# Patient Record
Sex: Male | Born: 1995 | Race: White | Hispanic: No | Marital: Single | Smoking: Never smoker
Health system: Southern US, Community
[De-identification: ages and names within clinical notes are randomized; demographics above are authoritative.]

---

## 2018-07-24 ENCOUNTER — Emergency Department (HOSPITAL_COMMUNITY): Payer: BLUE CROSS/BLUE SHIELD

## 2018-07-24 ENCOUNTER — Emergency Department (HOSPITAL_COMMUNITY)
Admission: EM | Admit: 2018-07-24 | Discharge: 2018-07-24 | Disposition: A | Discharge status: Home / Self Care | Payer: BLUE CROSS/BLUE SHIELD | Attending: Emergency Medicine | Admitting: Emergency Medicine

## 2018-07-24 ENCOUNTER — Encounter (HOSPITAL_COMMUNITY): Payer: Self-pay | Admitting: Emergency Medicine

## 2018-07-24 DIAGNOSIS — R05 Cough: Secondary | ICD-10-CM | POA: Diagnosis present

## 2018-07-24 DIAGNOSIS — J40 Bronchitis, not specified as acute or chronic: Secondary | ICD-10-CM | POA: Diagnosis not present

## 2018-07-24 LAB — CBC WITH DIFFERENTIAL/PLATELET
ABS IMMATURE GRANULOCYTES: 0.06 10*3/uL (ref 0.00–0.07)
BASOS PCT: 0 %
Basophils Absolute: 0 10*3/uL (ref 0.0–0.1)
EOS ABS: 0.5 10*3/uL (ref 0.0–0.5)
EOS PCT: 4 %
HEMATOCRIT: 48.7 % (ref 39.0–52.0)
HEMOGLOBIN: 15.9 g/dL (ref 13.0–17.0)
Immature Granulocytes: 0 %
Lymphocytes Relative: 7 %
Lymphs Abs: 0.9 10*3/uL (ref 0.7–4.0)
MCH: 27.7 pg (ref 26.0–34.0)
MCHC: 32.6 g/dL (ref 30.0–36.0)
MCV: 85 fL (ref 80.0–100.0)
MONO ABS: 0.9 10*3/uL (ref 0.1–1.0)
Monocytes Relative: 7 %
NEUTROS ABS: 11 10*3/uL — AB (ref 1.7–7.7)
Neutrophils Relative %: 82 %
Platelets: 225 10*3/uL (ref 150–400)
RBC: 5.73 MIL/uL (ref 4.22–5.81)
RDW: 12.7 % (ref 11.5–15.5)
WBC: 13.4 10*3/uL — AB (ref 4.0–10.5)
nRBC: 0 % (ref 0.0–0.2)

## 2018-07-24 LAB — BASIC METABOLIC PANEL
ANION GAP: 9 (ref 5–15)
BUN: 10 mg/dL (ref 6–20)
CALCIUM: 9.3 mg/dL (ref 8.9–10.3)
CO2: 25 mmol/L (ref 22–32)
Chloride: 102 mmol/L (ref 98–111)
Creatinine, Ser: 1.01 mg/dL (ref 0.61–1.24)
GFR calc non Af Amer: >60 mL/min (ref >60)
Glucose, Bld: 102 mg/dL — ABNORMAL HIGH (ref 70–99)
Potassium: 3.8 mmol/L (ref 3.5–5.1)
Sodium: 136 mmol/L (ref 135–145)

## 2018-07-24 LAB — I-STAT TROPONIN, ED: Troponin i, poc: 0.01 ng/mL (ref 0.00–0.08)

## 2018-07-24 MED ORDER — PROMETHAZINE-DM 6.25-15 MG/5ML PO SYRP: 5.0000 mL | ORAL_SOLUTION | Freq: Four times a day (QID) | ORAL | 0 refills | Status: AC | PRN

## 2018-07-24 MED ORDER — PREDNISONE 50 MG PO TABS: 50.0000 mg | ORAL_TABLET | Freq: Every day | ORAL | 0 refills | Status: AC

## 2018-07-24 MED ORDER — ACETAMINOPHEN 500 MG PO TABS
1000.0000 mg | ORAL_TABLET | Freq: Once | ORAL | Status: AC
  Administered 2018-07-24: 1000 mg via ORAL
  Filled 2018-07-24: qty 2

## 2018-07-24 MED ORDER — SODIUM CHLORIDE 0.9 % IV BOLUS
1000.0000 mL | Freq: Once | INTRAVENOUS | Status: AC
  Administered 2018-07-24: 1000 mL via INTRAVENOUS

## 2018-07-24 MED ORDER — FLUTICASONE PROPIONATE 50 MCG/ACT NA SUSP: 1.0000 | Freq: Every day | NASAL | 0 refills | Status: AC

## 2018-07-24 MED ORDER — BENZONATATE 100 MG PO CAPS: 100.0000 mg | ORAL_CAPSULE | Freq: Three times a day (TID) | ORAL | 0 refills | Status: AC

## 2018-07-24 MED ORDER — ALBUTEROL SULFATE HFA 108 (90 BASE) MCG/ACT IN AERS
1.0000 | INHALATION_SPRAY | Freq: Once | RESPIRATORY_TRACT | Status: AC
  Administered 2018-07-24: 1 via RESPIRATORY_TRACT
  Filled 2018-07-24: qty 6.7

## 2018-07-24 MED ORDER — IPRATROPIUM-ALBUTEROL 0.5-2.5 (3) MG/3ML IN SOLN
3.0000 mL | Freq: Once | RESPIRATORY_TRACT | Status: AC
  Administered 2018-07-24: 3 mL via RESPIRATORY_TRACT
  Filled 2018-07-24: qty 3

## 2018-07-24 NOTE — ED Notes (Signed)
Pt transported to xray 

## 2018-07-24 NOTE — ED Notes (Signed)
E-signature not available, pt verbalized understanding of DC instructions and prescriptions 

## 2018-07-24 NOTE — Discharge Instructions (Addendum)
You likely have a viral illness.  This should be treated symptomatically. Take prednisone as prescribed.  Use Tylenol or ibuprofen as needed for fevers or body aches. Use Flonase daily for nasal congestion and cough. Use cough drops and syrups as needed.  Make sure you stay well-hydrated with water. Wash your hands frequently to prevent spread of infection. Return to the emergency room if you develop increased difficulty breathing, or any new, worsening, or concerning symptoms.

## 2018-07-24 NOTE — ED Provider Notes (Signed)
[facility] EMERGENCY DEPARTMENT Provider Note   CSN: [id number] Arrival date & time: 07/24/18  1932     History   Chief Complaint No chief complaint on file.   HPI Samuel Scott is a 22 y.o. male for evaluation of cough, chest tightness, nasal congestion, and shortness of breath.  Patient states his symptoms began 3 days ago.  Started as a sore throat.  He then developed nasal congestion and a productive cough.  Cough is worse at night, producing yellow sputum.  Today he developed chest tightness and shortness of breath.  Pt states he vomited phelgm 2 times after severe coughing fits.  He denies known fevers or chills.  He denies ear pain, chest pain, abdominal pain, urinary symptoms, abnormal bowel movements.  He has no medical problems, takes no medications daily.  He denies tobacco use.  He reports intermittent alcohol use.  He denies history of asthma or COPD.  He takes no medications daily.  He tried DayQuil and NyQuil without improvement of his symptoms.  He has not tried anything else.  He denies sick contacts.  He denies recent travel, surgeries, immobilization, trauma, history of cancer, history of previous DVT/PE, or hormone use.  HPI  History reviewed. No pertinent past medical history.  There are no active problems to display for this patient.    Home Medications    Prior to Admission medications   Medication Sig Start Date End Date Taking? Authorizing Provider  benzonatate (TESSALON) 100 MG capsule Take 1 capsule (100 mg total) by mouth every 8 (eight) hours. 07/24/18   Jerilyn Gillaspie, PA-C  fluticasone (FLONASE) 50 MCG/ACT nasal spray Place 1 spray into both nostrils daily. 07/24/18   Tifanny Dollens, PA-C  predniSONE (DELTASONE) 50 MG tablet Take 1 tablet (50 mg total) by mouth daily for 5 days. 07/24/18 07/29/18  Lavine Hargrove, PA-C  promethazine-dextromethorphan (PROMETHAZINE-DM) 6.25-15 MG/5ML syrup Take 5 mLs by mouth 4 (four) times  daily as needed. 07/24/18   Llewellyn Schoenberger, PA-C    Family History No family history on file.  Social History Social History   Tobacco Use  . Smoking status: Never Smoker  . Smokeless tobacco: Never Used  Substance Use Topics  . Alcohol use: Yes    Comment: Socially  . Drug use: Not Currently     Allergies   Patient has no known allergies.   Review of Systems Review of Systems  HENT: Positive for congestion and sore throat.   Respiratory: Positive for cough, chest tightness and shortness of breath.   Gastrointestinal: Positive for vomiting.  All other systems reviewed and are negative.    Physical Exam Updated Vital Signs BP 118/68   Pulse 99   Temp (!) 97.3 F (36.3 C) (Oral)   Resp (!) 24   SpO2 94%   Physical Exam  Constitutional: He is oriented to person, place, and time. He appears well-developed and well-nourished. No distress.  Appears uncomfortable, but nontoxic.  Feels warm to the touch.  HENT:  Head: Normocephalic and atraumatic.  Right Ear: Tympanic membrane, external ear and ear canal normal.  Left Ear: Tympanic membrane, external ear and ear canal normal.  Nose: Mucosal edema and rhinorrhea present. Right sinus exhibits no maxillary sinus tenderness and no frontal sinus tenderness. Left sinus exhibits no maxillary sinus tenderness and no frontal sinus tenderness.  Mouth/Throat: Uvula is midline and mucous membranes are normal. Posterior oropharyngeal erythema present. No oropharyngeal exudate or tonsillar abscesses. Tonsils are 1+ on the right. Tonsils  are 1+ on the left. No tonsillar exudate.  Bilateral tonsillar swelling and OP erythema without exudate.  Nasal congestion.  No tenderness palpation of the sinuses.  TMs nonerythematous nonbulging bilaterally.  Eyes: Pupils are equal, round, and reactive to light. Conjunctivae and EOM are normal.  Neck: Normal range of motion. Neck supple.  Cardiovascular: Regular rhythm and intact distal pulses.    Mild tachycardic around 105  Pulmonary/Chest: Effort normal. No accessory muscle usage. No respiratory distress. He has no decreased breath sounds. He has wheezes in the left upper field and the left lower field. He has no rhonchi. He has no rales.  Speaking in full sentences. Expiratory wheezing, mostly of the L upper and lower lobes  Abdominal: Soft. He exhibits no distension and no mass. There is no tenderness. There is no guarding.  No ttp of the abd  Musculoskeletal: Normal range of motion. He exhibits no edema or tenderness.  No leg pain or swelling  Lymphadenopathy:    He has no cervical adenopathy.  Neurological: He is alert and oriented to person, place, and time. No sensory deficit.  Skin: Skin is warm and dry. Capillary refill takes less than 2 seconds.  Psychiatric: He has a normal mood and affect.  Nursing note and vitals reviewed.    ED Treatments / Results  Labs (all labs ordered are listed, but only abnormal results are displayed) Labs Reviewed  BASIC METABOLIC PANEL - Abnormal; Notable for the following components:      Result Value   Glucose, Bld 102 (*)    All other components within normal limits  CBC WITH DIFFERENTIAL/PLATELET - Abnormal; Notable for the following components:   WBC 13.4 (*)    Neutro Abs 11.0 (*)    All other components within normal limits  I-STAT TROPONIN, ED    EKG None  Radiology Dg Chest 2 View  Result Date: 07/24/2018 CLINICAL DATA:  Chest pain EXAM: CHEST - 2 VIEW COMPARISON:  None. FINDINGS: Heart and mediastinal contours are within normal limits. No focal opacities or effusions. No acute bony abnormality. IMPRESSION: No active cardiopulmonary disease. Electronically Signed   By: Charlett Nose M.D.   On: 07/24/2018 20:11    Procedures Procedures (including critical care time)  Medications Ordered in ED Medications  albuterol (PROVENTIL HFA;VENTOLIN HFA) 108 (90 Base) MCG/ACT inhaler 1 puff (has no administration in time  range)  ipratropium-albuterol (DUONEB) 0.5-2.5 (3) MG/3ML nebulizer solution 3 mL (3 mLs Nebulization Given 07/24/18 2009)  sodium chloride 0.9 % bolus 1,000 mL (0 mLs Intravenous Stopped 07/24/18 2054)  acetaminophen (TYLENOL) tablet 1,000 mg (1,000 mg Oral Given 07/24/18 2009)     Initial Impression / Assessment and Plan / ED Course  I have reviewed the triage vital signs and the nursing notes.  Pertinent labs & imaging results that were available during my care of the patient were reviewed by me and considered in my medical decision making (see chart for details).     Pt presenting for evaluation of URI symptoms for the past several days.  Physical exam shows patient who is mildly tachycardic and has wheezing on exam.  No history of asthma or COPD.  Will obtain chest x-ray to rule out pneumonia due to tachycardia and tachypnea.  Will give breathing treatment for wheezing and chest tightness and reassess.  Basic labs drawn.  EKG without STEMI.  Chest x-ray viewed interpreted by me, no pneumonia, pneumothorax, effusions, or cardiomegaly.  Labs show leukocytosis, but otherwise reassuring.  Troponin negative.  On reassessment, patient reports he is breathing much easier after the breathing treatment.  Wheezing has resolved.  At this time, doubt pneumonia.  Low suspicion for PE.  Likely bronchitis.  HR improved with fluids and rx. Sats stable. Discussed findings with patient.  Discussed treatment with albuterol, prednisone, antitussives, and Flonase.  Discussed follow-up as needed.  Strict return precautions given. Including any worsening of respiratory status.  At this time, patient received discharge.  Patient states he understands agrees plan.   Final Clinical Impressions(s) / ED Diagnoses   Final diagnoses:  Bronchitis    ED Discharge Orders         Ordered    fluticasone (FLONASE) 50 MCG/ACT nasal spray  Daily     07/24/18 2049    predniSONE (DELTASONE) 50 MG tablet  Daily      07/24/18 2049    benzonatate (TESSALON) 100 MG capsule  Every 8 hours     07/24/18 2049    promethazine-dextromethorphan (PROMETHAZINE-DM) 6.25-15 MG/5ML syrup  4 times daily PRN     07/24/18 2049           Alveria ApleyCaccavale, Raelle Chambers, PA-C 07/24/18 2104    Sabas SousBero, Michael M, MD 07/24/18 2253

## 2018-07-24 NOTE — ED Triage Notes (Signed)
Pt reports cold like S/S present since Friday. Pt states he developed chest tightness onset this AM. Has tried OTC meds with no relief.

## 2019-10-30 IMAGING — DX DG CHEST 2V
2 series · 2 of 2 positions shown · non-contrast
Comparison: None.

CLINICAL DATA: Chest pain

EXAM:
CHEST - 2 VIEW

[chest pa]
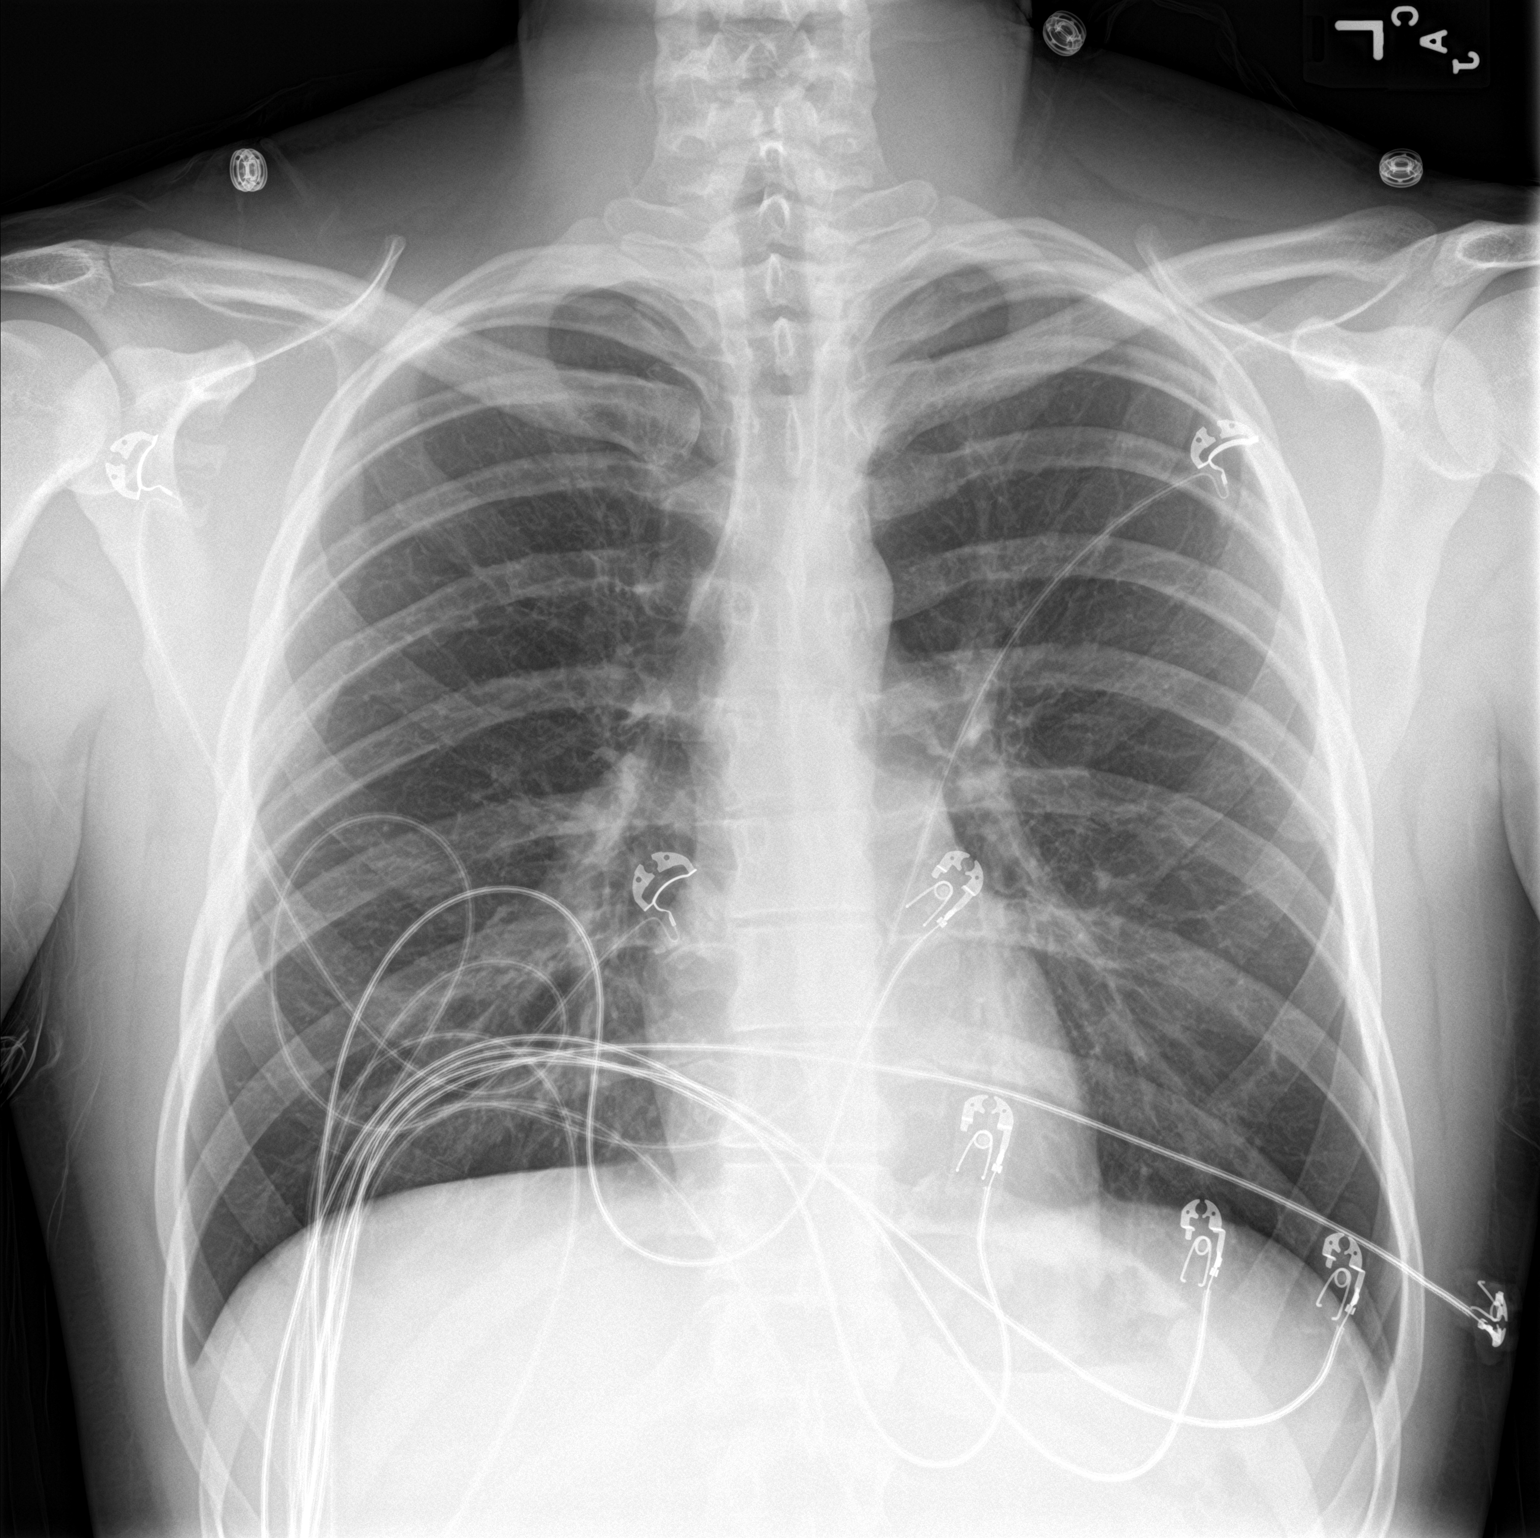

[chest lat]
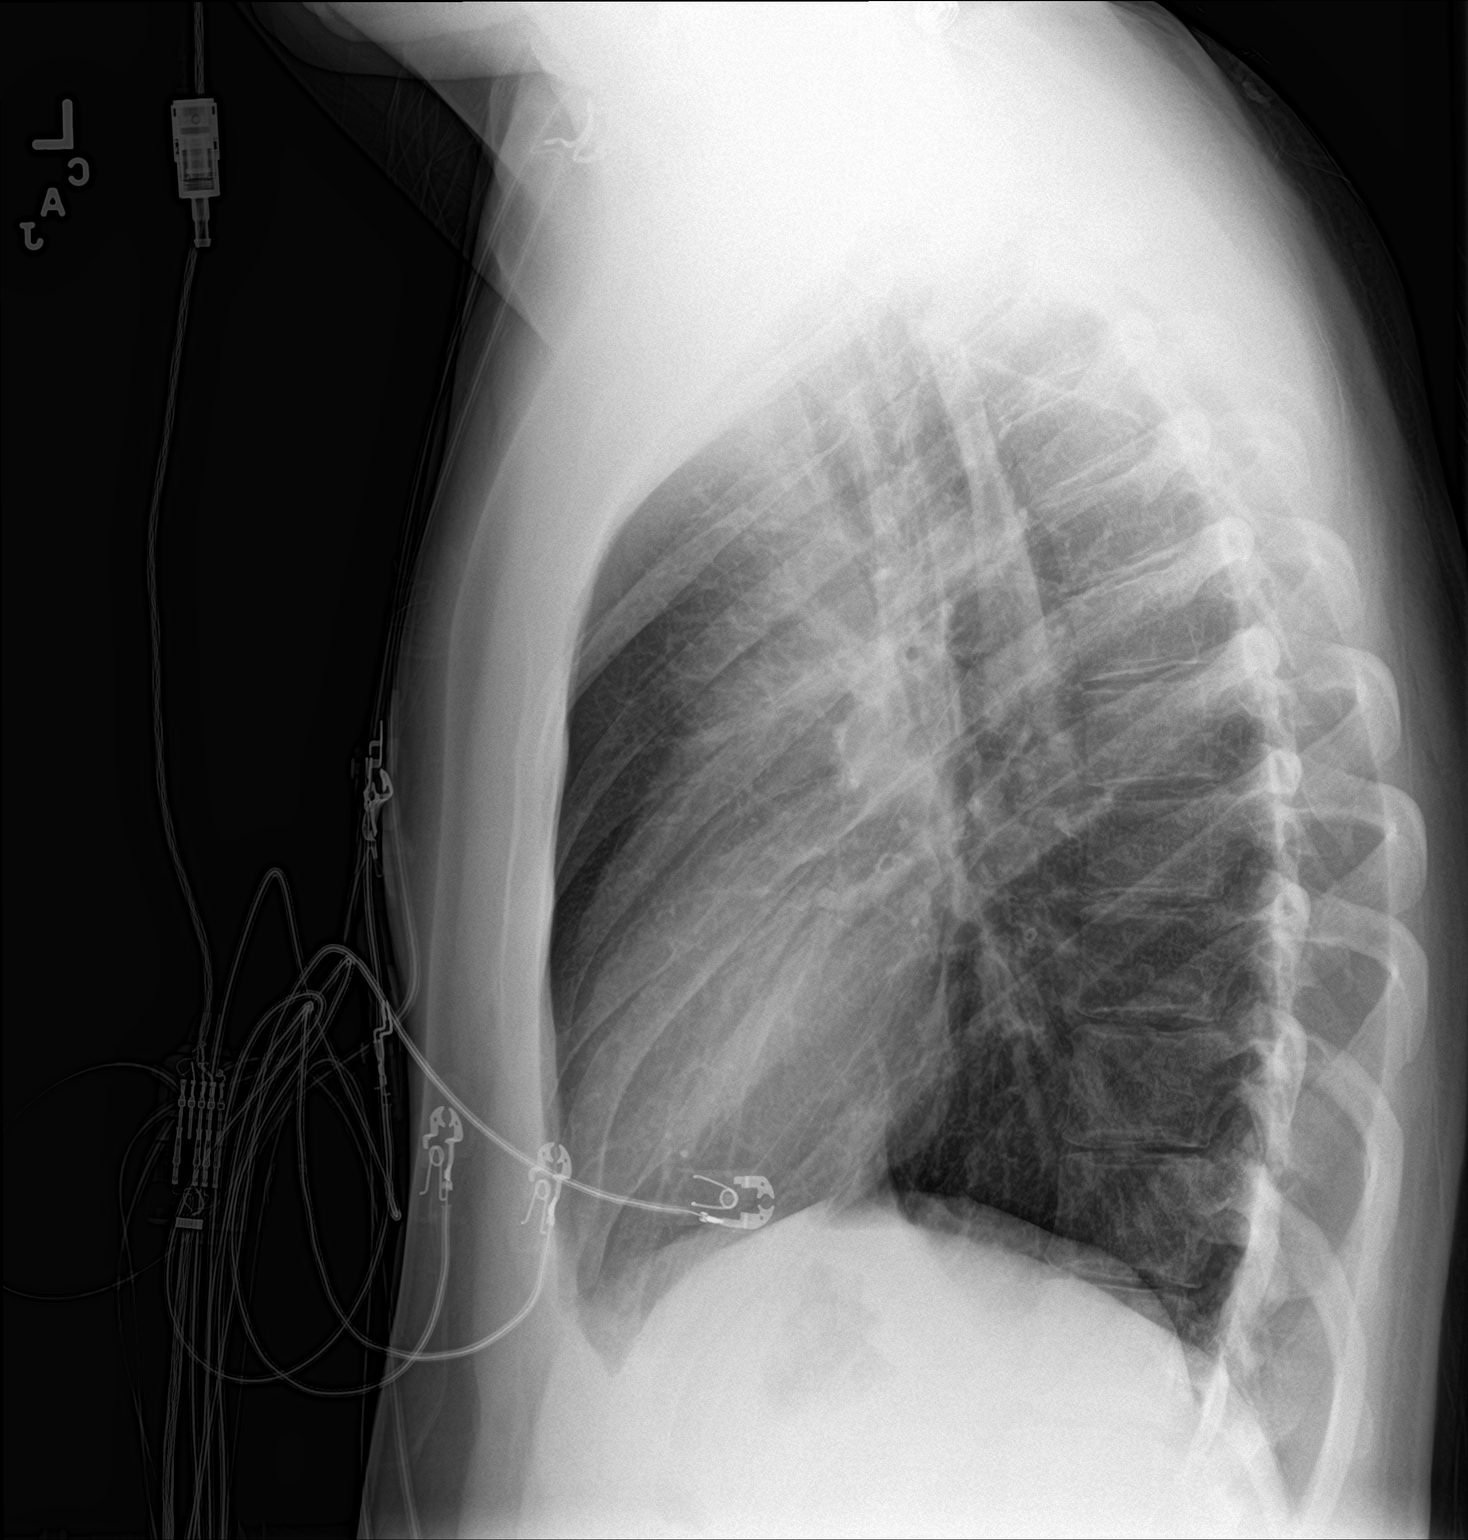

[2 of 2 positions shown; findings below may reference images not displayed]

FINDINGS: Heart and mediastinal contours are within normal limits. No focal
opacities or effusions. No acute bony abnormality.
IMPRESSION: No active cardiopulmonary disease.
